# Patient Record
Sex: Female | Born: 1985
Health system: Southern US, Community
[De-identification: ages and names within clinical notes are randomized; demographics above are authoritative.]

## PROBLEM LIST (undated history)

## (undated) DIAGNOSIS — Z789 Other specified health status: Secondary | ICD-10-CM

## (undated) HISTORY — DX: Other specified health status: Z78.9

## (undated) HISTORY — PX: NO PAST SURGERIES: SHX2092

---

## 2012-05-23 ENCOUNTER — Ambulatory Visit (INDEPENDENT_AMBULATORY_CARE_PROVIDER_SITE_OTHER): Payer: 59 | Admitting: Physician Assistant

## 2012-05-23 VITALS — BP 102/68 | HR 78 | Temp 98.5°F | Resp 16 | Ht 65.78 in | Wt 145.8 lb

## 2012-05-23 DIAGNOSIS — L03019 Cellulitis of unspecified finger: Secondary | ICD-10-CM

## 2012-05-23 DIAGNOSIS — IMO0001 Reserved for inherently not codable concepts without codable children: Secondary | ICD-10-CM

## 2012-05-23 DIAGNOSIS — M79644 Pain in right finger(s): Secondary | ICD-10-CM

## 2012-05-23 DIAGNOSIS — M79609 Pain in unspecified limb: Secondary | ICD-10-CM

## 2012-05-23 MED ORDER — DOXYCYCLINE HYCLATE 100 MG PO TABS: 100.0000 mg | ORAL_TABLET | Freq: Two times a day (BID) | ORAL | Status: AC

## 2012-05-23 NOTE — Progress Notes (Signed)
  Subjective:    Patient ID: Tanya Cox, female    DOB: January 25, 1986, 26 y.o.   MRN: [id number]  HPI Pt presents to clinic with index finger of R hand - she has noticed increased erythema and swelling - she is worried that she has an infection - there was no trauma to her finger that she knows of.  No fever or chills.  Review of Systems  Constitutional: Negative for fever and chills.  Skin: Positive for color change and wound. Negative for rash.       Objective:   Physical Exam  Constitutional: She is oriented to person, place, and time. She appears well-developed and well-nourished.  HENT:  Head: Normocephalic and atraumatic.  Right Ear: External ear normal.  Left Ear: External ear normal.  Nose: Nose normal.  Eyes: Conjunctivae are normal.  Neck: Normal range of motion.  Pulmonary/Chest: Effort normal.  Neurological: She is alert and oriented to person, place, and time.  Skin: Skin is warm and dry.     Psychiatric: She has a normal mood and affect. Her behavior is normal. Judgment and thought content normal.          Assessment & Plan:   1. Paronychia of second finger, right  doxycycline (VIBRA-TABS) 100 MG tablet   Pt to use warm soaks to increase healing.  D/w pt warning signs and when to RTC but doubt pt will need further treatment.  Pt agreed and understands the above.

## 2013-02-18 ENCOUNTER — Ambulatory Visit: Payer: Self-pay

## 2013-02-18 ENCOUNTER — Other Ambulatory Visit: Payer: Self-pay | Admitting: Occupational Medicine

## 2013-02-18 DIAGNOSIS — R7612 Nonspecific reaction to cell mediated immunity measurement of gamma interferon antigen response without active tuberculosis: Secondary | ICD-10-CM

## 2013-11-30 IMAGING — CR DG CHEST 1V
1 series · 1 of 1 positions shown · non-contrast
Comparison: None.

CLINICAL DATA: Positive blood test for tuberculosis.

CHEST - 1 VIEW

[view not recorded]
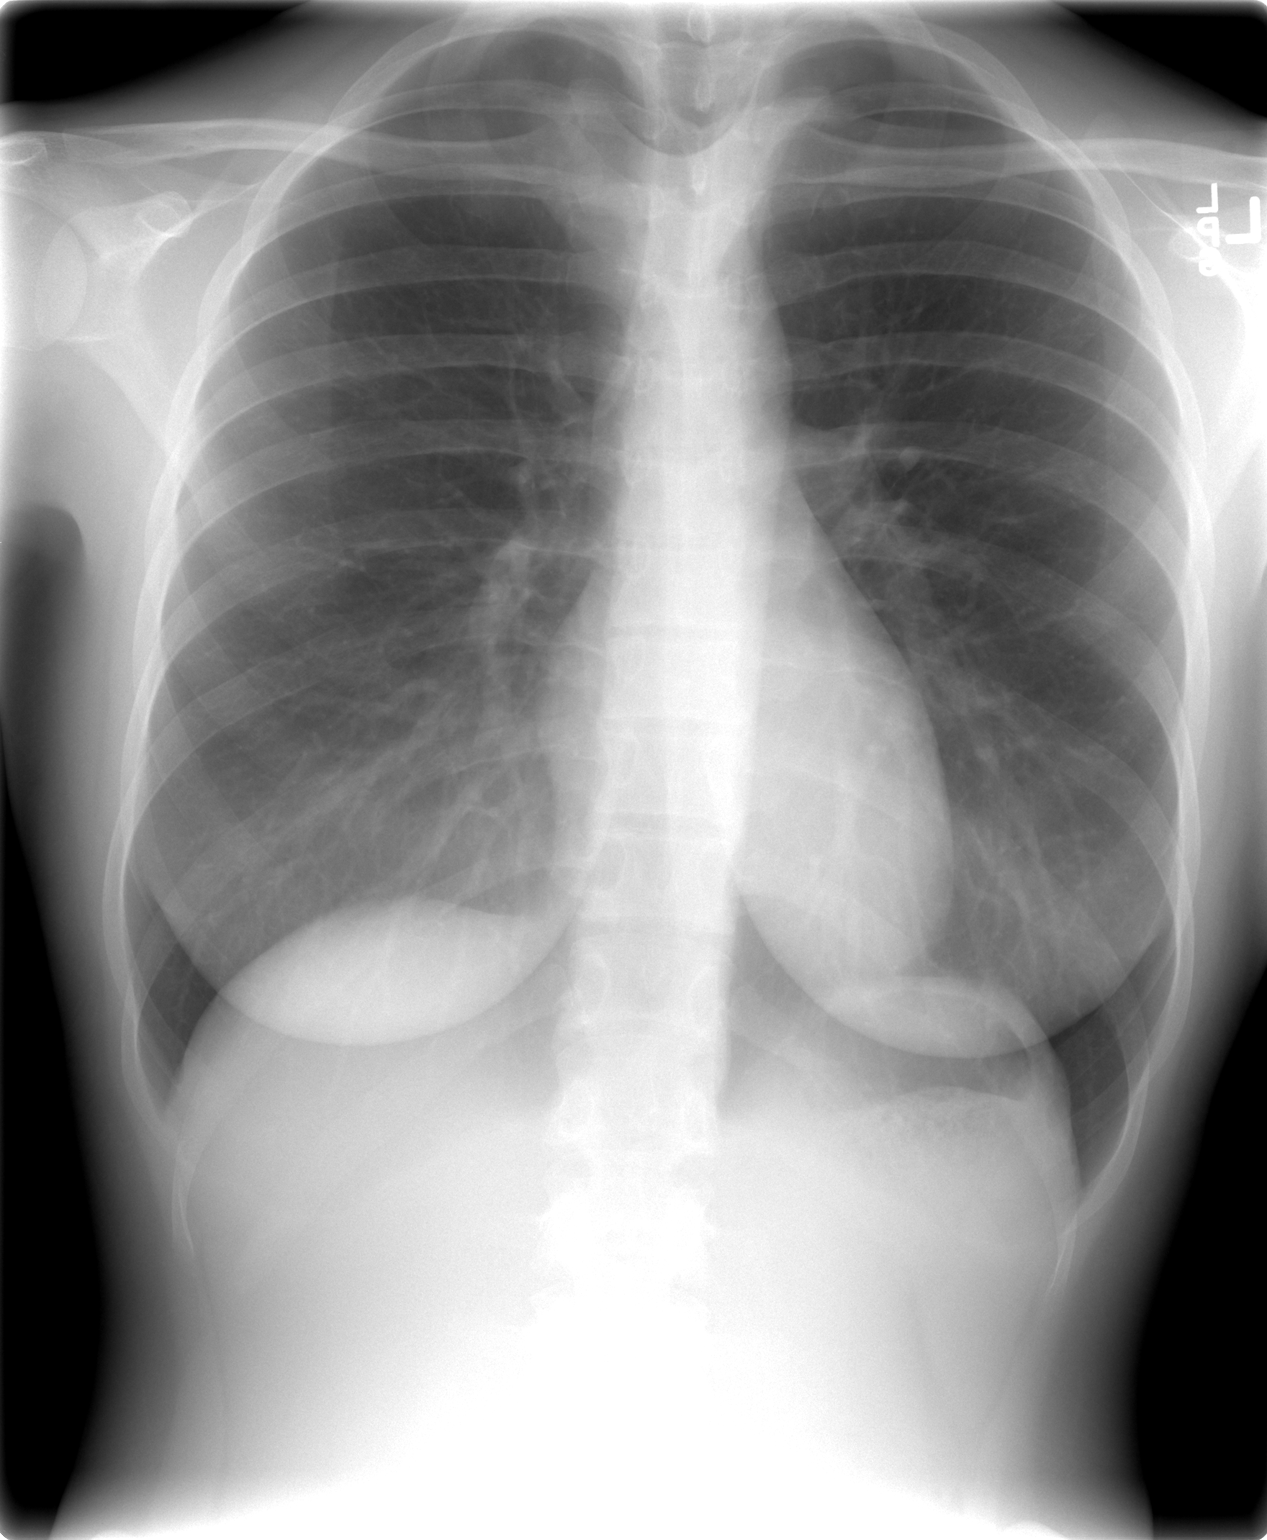

[1 of 1 positions shown; findings below may reference images not displayed]

FINDINGS: Cardiomediastinal silhouette unremarkable.   Lungs clear.
Bronchovascular markings normal.  Pulmonary vascularity normal.  No
pneumothorax.  No pleural effusions.
IMPRESSION: No acute cardiopulmonary disease.

## 2014-11-16 ENCOUNTER — Ambulatory Visit (INDEPENDENT_AMBULATORY_CARE_PROVIDER_SITE_OTHER): Payer: 59 | Admitting: Physician Assistant

## 2014-11-16 VITALS — BP 114/70 | HR 74 | Temp 98.4°F | Resp 18 | Ht 65.5 in | Wt 155.4 lb

## 2014-11-16 DIAGNOSIS — Z111 Encounter for screening for respiratory tuberculosis: Secondary | ICD-10-CM

## 2014-11-16 NOTE — Progress Notes (Signed)
   Subjective:    Patient ID: Tanya Cox, female    DOB: Feb 09, 1986, 29 y.o.   MRN: [id number]  HPI  This is a 29 year old female presenting for TB test for school. She has not recently traveled or been around anyone with TB. She denies cough, fever,  malaise, night sweats or hemoptysis. She reports approx. 2 years ago she had a positive quantiferon gold test - repeat testing and chest xray negative. It was determined to be a false positive. She wishes to do the quantiferon gold test again d/t convenience.  Review of Systems  Constitutional: Negative for fever, chills and diaphoresis.  Respiratory: Negative for cough.    There are no active problems to display for this patient.  Prior to Admission medications   Not on File   No Known Allergies  Patient's social and family history were reviewed.    Objective:   Physical Exam  Constitutional: She is oriented to person, place, and time. She appears well-developed and well-nourished. No distress.  HENT:  Head: Normocephalic and atraumatic.  Right Ear: Hearing normal.  Left Ear: Hearing normal.  Nose: Nose normal.  Eyes: Conjunctivae and lids are normal. Right eye exhibits no discharge. Left eye exhibits no discharge. No scleral icterus.  Pulmonary/Chest: Effort normal. No respiratory distress.  Musculoskeletal: Normal range of motion.  Neurological: She is alert and oriented to person, place, and time.  Skin: Skin is warm, dry and intact. No lesion and no rash noted.  Psychiatric: She has a normal mood and affect. Her speech is normal and behavior is normal. Thought content normal.   BP 114/70 mmHg  Pulse 74  Temp(Src) 98.4 F (36.9 C) (Oral)  Resp 18  Ht 5' 5.5" (1.664 m)  Wt 155 lb 6.4 oz (70.489 kg)  BMI 25.46 kg/m2  SpO2 99%  LMP 11/13/2014     Assessment & Plan:  1. Screening-pulmonary TB - Quantiferon tb gold assay   [place]. Dyke Brackett, [facility]  11/16/2014

## 2014-11-16 NOTE — Patient Instructions (Signed)
Will send your results to you when available. Return with any concerns

## 2014-11-19 LAB — QUANTIFERON TB GOLD ASSAY (BLOOD)
INTERFERON GAMMA RELEASE ASSAY: NEGATIVE
MITOGEN VALUE: 9.68 [IU]/mL
QUANTIFERON TB AG MINUS NIL: 0 [IU]/mL
Quantiferon Nil Value: 0.02 IU/mL
TB AG VALUE: 0.02 [IU]/mL

## 2015-11-11 ENCOUNTER — Ambulatory Visit (INDEPENDENT_AMBULATORY_CARE_PROVIDER_SITE_OTHER): Payer: 59 | Admitting: Emergency Medicine

## 2015-11-11 VITALS — BP 110/64 | HR 84 | Temp 98.4°F | Resp 18 | Wt 154.0 lb

## 2015-11-11 DIAGNOSIS — Z111 Encounter for screening for respiratory tuberculosis: Secondary | ICD-10-CM | POA: Diagnosis not present

## 2015-11-11 NOTE — Progress Notes (Signed)
  Tuberculosis Risk Questionnaire  1. No Were you born outside the [place] in one of the following parts of the world: [place], [place], [place], [place] or Eastern [place]?    2. No Have you traveled outside the [place] and lived for more than one month in one of the following parts of the world: [place], [place], [place], [place] or [place]?    3. No Do you have a compromised immune system such as from any of the following conditions:HIV/AIDS, organ or bone marrow transplantation, diabetes, immunosuppressive medicines (e.g. Prednisone, Remicaide), leukemia, lymphoma, cancer of the head or neck, gastrectomy or jejunal bypass, end-stage renal disease (on dialysis), or silicosis?     4. Yes  Have you ever or do you plan on working in: a residential care center, a health care facility, a jail or prison or homeless shelter?    5. No Have you ever: injected illegal drugs, used crack cocaine, lived in a homeless shelter  or been in jail or prison?     6. Yes  Have you ever been exposed to anyone with infectious tuberculosis?    Tuberculosis Symptom Questionnaire  Do you currently have any of the following symptoms?  1. No Unexplained cough lasting more than 3 weeks?   2. No Unexplained fever lasting more than 3 weeks.   3. No Night Sweats (sweating that leaves the bedclothes and sheets wet)     4. No Shortness of Breath   5. No Chest Pain   6. No Unintentional weight loss    7. No Unexplained fatigue (very tired for no reason)   

## 2015-11-11 NOTE — Progress Notes (Signed)
   Subjective:  Patient ID: Tanya Cox, female    DOB: 07/07/1986  Age: 29 y.o. MRN: [id number]  CC: Labs Only   HPI Tanya Cox presents   He is here for  A TB screening for  School. TB screen attached. History Tanya Cox has no past medical history on file.   She has no past surgical history on file.   Her  family history is not on file.  She   reports that she has never smoked. She does not have any smokeless tobacco history on file. Her alcohol and drug histories are not on file.  No outpatient prescriptions prior to visit.   No facility-administered medications prior to visit.    Social History   Social History  . Marital Status: Single    Spouse Name: N/A  . Number of Children: N/A  . Years of Education: N/A   Social History Main Topics  . Smoking status: Never Smoker   . Smokeless tobacco: None  . Alcohol Use: None  . Drug Use: None  . Sexual Activity: Not Asked   Other Topics Concern  . None   Social History Narrative     Review of Systems  Constitutional: Negative for fever, chills and appetite change.  HENT: Negative for congestion, ear pain, postnasal drip, sinus pressure and sore throat.   Eyes: Negative for pain and redness.  Respiratory: Negative for cough, shortness of breath and wheezing.   Cardiovascular: Negative for leg swelling.  Gastrointestinal: Negative for nausea, vomiting, abdominal pain, diarrhea, constipation and blood in stool.  Endocrine: Negative for polyuria.  Genitourinary: Negative for dysuria, urgency, frequency and flank pain.  Musculoskeletal: Negative for gait problem.  Skin: Negative for rash.  Neurological: Negative for weakness and headaches.  Psychiatric/Behavioral: Negative for confusion and decreased concentration. The patient is not nervous/anxious.     Objective:  BP 110/64 mmHg  Pulse 84  Temp(Src) 98.4 F (36.9 C) (Oral)  Resp 18  Wt 154 lb (69.854 kg)  SpO2 99%  LMP 10/28/2015  Physical  Exam    Assessment & Plan:   Tanya Cox was seen today for labs only.  Diagnoses and all orders for this visit:  Screening-pulmonary TB -     Quantiferon tb gold assay   Ms. Bally does not currently have medications on file.  No orders of the defined types were placed in this encounter.    Appropriate red flag conditions were discussed with the patient as well as actions that should be taken.  Patient expressed his understanding.  Follow-up: Return if symptoms worsen or fail to improve.  Carmelina DaneAnderson, Nikita Humble S, MD

## 2015-11-13 LAB — QUANTIFERON TB GOLD ASSAY (BLOOD)
INTERFERON GAMMA RELEASE ASSAY: NEGATIVE
QUANTIFERON TB AG MINUS NIL: 0.02 [IU]/mL
Quantiferon Nil Value: 0.04 IU/mL
TB Ag value: 0.06 IU/mL

## 2015-11-14 ENCOUNTER — Encounter: Payer: Self-pay | Admitting: *Deleted

## 2016-05-26 DIAGNOSIS — H5203 Hypermetropia, bilateral: Secondary | ICD-10-CM | POA: Diagnosis not present

## 2018-03-04 ENCOUNTER — Ambulatory Visit: Payer: 59 | Admitting: Family Medicine

## 2018-03-04 ENCOUNTER — Encounter: Payer: Self-pay | Admitting: Family Medicine

## 2018-03-04 VITALS — BP 108/64 | HR 75 | Temp 98.6°F | Ht 66.0 in | Wt 149.4 lb

## 2018-03-04 DIAGNOSIS — Z0000 Encounter for general adult medical examination without abnormal findings: Secondary | ICD-10-CM

## 2018-03-04 DIAGNOSIS — Z114 Encounter for screening for human immunodeficiency virus [HIV]: Secondary | ICD-10-CM | POA: Diagnosis not present

## 2018-03-04 LAB — COMPREHENSIVE METABOLIC PANEL
ALT: 13 U/L (ref 0–35)
AST: 16 U/L (ref 0–37)
Albumin: 3.8 g/dL (ref 3.5–5.2)
Alkaline Phosphatase: 53 U/L (ref 39–117)
BILIRUBIN TOTAL: 0.5 mg/dL (ref 0.2–1.2)
BUN: 16 mg/dL (ref 6–23)
CO2: 27 mEq/L (ref 19–32)
CREATININE: 0.67 mg/dL (ref 0.40–1.20)
Calcium: 9.2 mg/dL (ref 8.4–10.5)
Chloride: 106 mEq/L (ref 96–112)
GFR: 110.12 mL/min (ref >60.00)
GLUCOSE: 81 mg/dL (ref 70–99)
Potassium: 3.8 mEq/L (ref 3.5–5.1)
SODIUM: 142 meq/L (ref 135–145)
Total Protein: 6.6 g/dL (ref 6.0–8.3)

## 2018-03-04 LAB — LIPID PANEL
CHOL/HDL RATIO: 2
Cholesterol: 141 mg/dL (ref 0–200)
HDL: 76.6 mg/dL (ref >39.00)
LDL Cholesterol: 54 mg/dL (ref 0–99)
NONHDL: 63.99
Triglycerides: 52 mg/dL (ref 0.0–149.0)
VLDL: 10.4 mg/dL (ref 0.0–40.0)

## 2018-03-04 LAB — CBC
HCT: 38.4 % (ref 36.0–46.0)
Hemoglobin: 13.3 g/dL (ref 12.0–15.0)
MCHC: 34.6 g/dL (ref 30.0–36.0)
MCV: 89.6 fl (ref 78.0–100.0)
PLATELETS: 197 10*3/uL (ref 150.0–400.0)
RBC: 4.29 Mil/uL (ref 3.87–5.11)
RDW: 13.2 % (ref 11.5–15.5)
WBC: 3 10*3/uL — ABNORMAL LOW (ref 4.0–10.5)

## 2018-03-04 NOTE — Progress Notes (Signed)
Chief Complaint  Patient presents with  . Establish Care     Well Woman Tanya Cox is here for a complete physical.   Her last physical was >1 year ago.  Current diet: in general, a "healthy" diet- vegetarian. Current exercise: walking. Weight is stable and she denies daytime fatigue. No LMP recorded. Seatbelt? Yes  Health Maintenance Pap/HPV- No Tetanus- Yes 08/13/2017 HIV screening- No  Past Medical History:  Diagnosis Date  . No known health problems      Past Surgical History:  Procedure Laterality Date  . NO PAST SURGERIES      Medications  Takes no meds routinely.  Allergies No Known Allergies  Review of Systems: Constitutional:  no fevers Eye:  no recent significant change in vision Ear/Nose/Mouth/Throat:  Ears:  no tinnitus or vertigo and no recent change in hearing, Nose/Mouth/Throat:  no complaints of nasal congestion, no sore throat Cardiovascular: no chest pain, no palpitations Respiratory:  no cough and no shortness of breath Gastrointestinal:  no abdominal pain, no change in bowel habits, no significant change in appetite, no nausea, vomiting, diarrhea, or constipation and no black or bloody stool GU:  Female: negative for dysuria, frequency, and incontinence; no abnormal bleeding, pelvic pain, or discharge Musculoskeletal/Extremities:  no pain of the joints Integumentary (Skin/Breast):  no abnormal skin lesions reported Neurologic:  no headaches Endocrine:  denies fatigue Hematologic/Lymphatic:  no unexpected weight changes  Exam BP 108/64 (BP Location: Left Arm, Patient Position: Sitting, Cuff Size: Normal)   Pulse 75   Temp 98.6 F (37 C) (Oral)   Ht 5\' 6"  (1.676 m)   Wt 149 lb 6 oz (67.8 kg)   SpO2 99%   BMI 24.11 kg/m  General:  well developed, well nourished, in no apparent distress Skin:  no significant moles, warts, or growths Head:  no masses, lesions, or tenderness Eyes:  pupils equal and round, sclera anicteric without  injection Ears:  canals without lesions, TMs shiny without retraction, no obvious effusion, no erythema Nose:  nares patent, septum midline, mucosa normal, and no drainage or sinus tenderness Throat/Pharynx:  lips and gingiva without lesion; tongue and uvula midline; non-inflamed pharynx; no exudates or postnasal drainage Neck: neck supple without adenopathy, thyromegaly, or masses Breasts:  Not done Thorax:  nontender Lungs:  clear to auscultation, breath sounds equal bilaterally, no respiratory distress Cardio:  regular rate and rhythm without murmurs, heart sounds without clicks or rubs, point of maximal impulse normal; no lifts, heaves, or thrills Abdomen:  abdomen soft, nontender; bowel sounds normal; no masses or organomegaly Genital: Defer to GYN Musculoskeletal:  symmetrical muscle groups noted without atrophy or deformity Extremities:  no clubbing, cyanosis, or edema, no deformities, no skin discoloration Neuro:  gait normal; deep tendon reflexes normal and symmetric Psych: well oriented with normal range of affect and appropriate judgment/insight  Assessment and Plan  Well adult exam - Plan: CBC, Comprehensive metabolic panel, Lipid panel  Screening for HIV (human immunodeficiency virus) - Plan: HIV antibody   Well 32 y.o. female. Counseled on diet and exercise. Info for GYN provided. Other orders as above. Follow up in 1 yr or prn. The patient voiced understanding and agreement to the plan.  Jilda Rocheicholas Paul West[place]endling, DO 03/04/18 7:59 AM

## 2018-03-04 NOTE — Progress Notes (Signed)
Pre visit review using our clinic review tool, if applicable. No additional management support is needed unless otherwise documented below in the visit note. 

## 2018-03-04 NOTE — Patient Instructions (Addendum)
[facility] [place] at [phone] for an appointment.  They are located at [address], [address], [place], [place], [phone] (right across the hall from our office).  Keep up the good work. Consider lifting weights/pilates/yoga for upper body.   Let us know if you need anything.  1-2 days to get results of labs back.

## 2018-03-05 LAB — HIV ANTIBODY (ROUTINE TESTING W REFLEX): HIV 1&2 Ab, 4th Generation: NONREACTIVE

## 2018-03-06 ENCOUNTER — Other Ambulatory Visit: Payer: Self-pay | Admitting: Family Medicine

## 2018-03-06 DIAGNOSIS — D72819 Decreased white blood cell count, unspecified: Secondary | ICD-10-CM

## 2018-03-08 ENCOUNTER — Other Ambulatory Visit (INDEPENDENT_AMBULATORY_CARE_PROVIDER_SITE_OTHER): Payer: 59

## 2018-03-08 DIAGNOSIS — D72819 Decreased white blood cell count, unspecified: Secondary | ICD-10-CM

## 2018-03-08 LAB — CBC WITH DIFFERENTIAL/PLATELET
Basophils Absolute: 0.1 10*3/uL (ref 0.0–0.1)
Basophils Relative: 1.9 % (ref 0.0–3.0)
EOS PCT: 1.8 % (ref 0.0–5.0)
Eosinophils Absolute: 0.1 10*3/uL (ref 0.0–0.7)
HCT: 38.7 % (ref 36.0–46.0)
HEMOGLOBIN: 13.3 g/dL (ref 12.0–15.0)
LYMPHS ABS: 1.2 10*3/uL (ref 0.7–4.0)
Lymphocytes Relative: 38.6 % (ref 12.0–46.0)
MCHC: 34.3 g/dL (ref 30.0–36.0)
MCV: 89.2 fl (ref 78.0–100.0)
MONOS PCT: 11.4 % (ref 3.0–12.0)
Monocytes Absolute: 0.4 10*3/uL (ref 0.1–1.0)
Neutro Abs: 1.4 10*3/uL (ref 1.4–7.7)
Neutrophils Relative %: 46.3 % (ref 43.0–77.0)
Platelets: 215 10*3/uL (ref 150.0–400.0)
RBC: 4.34 Mil/uL (ref 3.87–5.11)
RDW: 12.9 % (ref 11.5–15.5)
WBC: 3.1 10*3/uL — AB (ref 4.0–10.5)

## 2018-03-08 LAB — EXTRA LAV TOP TUBE

## 2018-03-11 LAB — PATHOLOGIST SMEAR REVIEW

## 2018-08-29 ENCOUNTER — Ambulatory Visit (INDEPENDENT_AMBULATORY_CARE_PROVIDER_SITE_OTHER): Payer: Self-pay | Admitting: Family Medicine

## 2018-08-29 ENCOUNTER — Encounter: Payer: Self-pay | Admitting: Family Medicine

## 2018-08-29 VITALS — BP 120/80 | HR 84 | Temp 98.7°F | Ht 65.0 in | Wt 151.4 lb

## 2018-08-29 DIAGNOSIS — R350 Frequency of micturition: Secondary | ICD-10-CM

## 2018-08-29 DIAGNOSIS — R300 Dysuria: Secondary | ICD-10-CM

## 2018-08-29 LAB — POCT URINALYSIS DIPSTICK
Bilirubin, UA: NEGATIVE
Blood, UA: NEGATIVE
Glucose, UA: NEGATIVE
Ketones, UA: NEGATIVE
LEUKOCYTES UA: NEGATIVE
NITRITE UA: NEGATIVE
PROTEIN UA: NEGATIVE
SPEC GRAV UA: 1.015 (ref 1.010–1.025)
Urobilinogen, UA: NEGATIVE E.U./dL — AB
pH, UA: 7 (ref 5.0–8.0)

## 2018-08-29 NOTE — Patient Instructions (Signed)

## 2018-08-29 NOTE — Progress Notes (Signed)
Tanya Cox is a 32 y.o. female who presents today with concerns of urinary frequency for the last 24 hours. She denies ever having a UTI in the past and denies any risk for STI/STD or burning or discharge on urination.  Review of Systems  Constitutional: Negative for chills, fever and malaise/fatigue.  HENT: Negative for congestion, ear discharge, ear pain, sinus pain and sore throat.   Eyes: Negative.   Respiratory: Negative for cough, sputum production and shortness of breath.   Cardiovascular: Negative.  Negative for chest pain.  Gastrointestinal: Negative for abdominal pain, diarrhea, nausea and vomiting.  Genitourinary: Negative for dysuria, frequency, hematuria and urgency.  Musculoskeletal: Negative for myalgias.  Skin: Negative.   Neurological: Negative for headaches.  Endo/Heme/Allergies: Negative.   Psychiatric/Behavioral: Negative.     O: Vitals:   08/29/18 1750  BP: 120/80  Pulse: 84  Temp: 98.7 F (37.1 C)  SpO2: 100%     Physical Exam  Constitutional: She is oriented to person, place, and time. Vital signs are normal. She appears well-developed and well-nourished. She is active.  Non-toxic appearance. She does not have a sickly appearance.  HENT:  Head: Normocephalic.  Right Ear: Hearing, tympanic membrane, external ear and ear canal normal.  Left Ear: Hearing, tympanic membrane, external ear and ear canal normal.  Nose: Nose normal.  Mouth/Throat: Uvula is midline and oropharynx is clear and moist.  Neck: Normal range of motion. Neck supple.  Cardiovascular: Normal rate, regular rhythm, normal heart sounds and normal pulses.  Pulmonary/Chest: Effort normal and breath sounds normal.  Abdominal: Soft. Bowel sounds are normal. There is no tenderness. There is no rigidity, no rebound, no guarding and no CVA tenderness.  Musculoskeletal: Normal range of motion.  Lymphadenopathy:       Head (right side): No submental and no submandibular adenopathy present.      Head (left side): No submental and no submandibular adenopathy present.    She has no cervical adenopathy.  Neurological: She is alert and oriented to person, place, and time.  Psychiatric: She has a normal mood and affect.  Vitals reviewed.  A: 1. Dysuria   2. Urinary frequency    P: Discussed exam findings, diagnosis etiology and medication use and indications reviewed with patient. Follow- Up and discharge instructions provided. No emergent/urgent issues found on exam.  Patient verbalized understanding of information provided and agrees with plan of care (POC), all questions answered.  1. Dysuria - POCT Urinalysis Dipstick Results for orders placed or performed in visit on 08/29/18 (from the past 24 hour(s))  POCT Urinalysis Dipstick     Status: Abnormal   Collection Time: 08/29/18  6:16 PM  Result Value Ref Range   Color, UA yellow    Clarity, UA clear    Glucose, UA Negative Negative   Bilirubin, UA negative    Ketones, UA negative    Spec Grav, UA 1.015 1.010 - 1.025   Blood, UA negative    pH, UA 7.0 5.0 - 8.0   Protein, UA Negative Negative   Urobilinogen, UA negative (A) 0.2 or 1.0 E.U./dL   Nitrite, UA negative    Leukocytes, UA Negative Negative   Appearance clear    Odor      2. Urinary frequency Exam unremarkable. UA results negative- discussed an observation technique since patient has never had a UTI.

## 2019-04-14 ENCOUNTER — Ambulatory Visit (INDEPENDENT_AMBULATORY_CARE_PROVIDER_SITE_OTHER): Payer: Self-pay | Admitting: Physician Assistant

## 2019-04-14 ENCOUNTER — Other Ambulatory Visit: Payer: Self-pay

## 2019-04-14 DIAGNOSIS — Z111 Encounter for screening for respiratory tuberculosis: Secondary | ICD-10-CM

## 2019-04-16 LAB — TB SKIN TEST
Induration: 0 mm
TB Skin Test: NEGATIVE

## 2019-04-22 ENCOUNTER — Ambulatory Visit (INDEPENDENT_AMBULATORY_CARE_PROVIDER_SITE_OTHER): Payer: Self-pay | Admitting: Nurse Practitioner

## 2019-04-22 ENCOUNTER — Other Ambulatory Visit: Payer: Self-pay

## 2019-04-22 DIAGNOSIS — Z111 Encounter for screening for respiratory tuberculosis: Secondary | ICD-10-CM

## 2019-04-22 NOTE — Progress Notes (Signed)
Patient presents for PPD placement for school Denies previous positive TB test  Denies known exposure to TB   Tuberculin skin test applied to left ventral forearm.  Patient informed to return to [facility] in 48-72 hours for PPD read. Vaccine Information Statement provided to patient.

## 2019-04-24 LAB — TB SKIN TEST
Induration: NEGATIVE mm
TB Skin Test: NEGATIVE

## 2020-02-10 ENCOUNTER — Other Ambulatory Visit: Payer: Self-pay

## 2020-02-11 ENCOUNTER — Encounter: Payer: Self-pay | Admitting: Family Medicine

## 2020-02-11 ENCOUNTER — Other Ambulatory Visit: Payer: Self-pay

## 2020-02-11 ENCOUNTER — Ambulatory Visit (INDEPENDENT_AMBULATORY_CARE_PROVIDER_SITE_OTHER): Payer: 59 | Admitting: Family Medicine

## 2020-02-11 VITALS — BP 123/90 | HR 83 | Temp 97.8°F | Ht 66.0 in | Wt 169.2 lb

## 2020-02-11 DIAGNOSIS — Z0000 Encounter for general adult medical examination without abnormal findings: Secondary | ICD-10-CM

## 2020-02-11 NOTE — Progress Notes (Addendum)
Chief Complaint  Patient presents with  . Annual Exam    no complaints     Well Woman Tanya Cox is here for a complete physical.   Her last physical was >1 year ago.  Current diet: in general, a "healthy" diet. Current exercise: walking. Patient's last menstrual period was 02/05/2020 (approximate).  No fatigue.  Wt has increased.  Seatbelt? Yes  Health Maintenance Pap/HPV- No Tetanus- Yes HIV screening- Yes  Past Medical History:  Diagnosis Date  . No known health problems      Past Surgical History:  Procedure Laterality Date  . NO PAST SURGERIES      Medications  Takes no meds routinely.    Allergies No Known Allergies  Review of Systems: Constitutional:  no unexpected weight changes Eye:  no recent significant change in vision Ear/Nose/Mouth/Throat:  Ears:  no tinnitus or vertigo and no recent change in hearing Nose/Mouth/Throat:  no complaints of nasal congestion, no sore throat Cardiovascular: no chest pain Respiratory:  no cough and no shortness of breath Gastrointestinal:  no abdominal pain, no change in bowel habits GU:  Female: negative for dysuria or pelvic pain Musculoskeletal/Extremities:  no pain of the joints Integumentary (Skin/Breast):  no abnormal skin lesions reported Neurologic:  no headaches Endocrine:  denies fatigue Hematologic/Lymphatic:  No areas of easy bleeding  Exam BP 123/90 (BP Location: Left Arm, Patient Position: Sitting, Cuff Size: Normal)   Pulse 83   Temp 97.8 F (36.6 C) (Temporal)   Ht 5\' 6"  (1.676 m)   Wt 169 lb 3.2 oz (76.7 kg)   LMP 02/05/2020 (Approximate)   SpO2 100%   BMI 27.31 kg/m  General:  well developed, well nourished, in no apparent distress Skin:  no significant moles, warts, or growths Head:  no masses, lesions, or tenderness Eyes:  pupils equal and round, sclera anicteric without injection Ears:  canals without lesions, TMs shiny without retraction, no obvious effusion, no erythema Nose:   nares patent, septum midline, mucosa normal, and no drainage or sinus tenderness Throat/Pharynx:  lips and gingiva without lesion; tongue and uvula midline; non-inflamed pharynx; no exudates or postnasal drainage Neck: neck supple without adenopathy, thyromegaly, or masses Lungs:  clear to auscultation, breath sounds equal bilaterally, no respiratory distress Cardio:  regular rate and rhythm, no bruits, no LE edema Abdomen:  abdomen soft, nontender; bowel sounds normal; no masses or organomegaly Genital: Defer to GYN Musculoskeletal:  symmetrical muscle groups noted without atrophy or deformity Extremities:  no clubbing, cyanosis, or edema, no deformities, no skin discoloration Neuro:  gait normal; deep tendon reflexes normal and symmetric Psych: well oriented with normal range of affect and appropriate judgment/insight  Assessment and Plan  Well adult exam - Plan: CBC, Comprehensive metabolic panel, Lipid panel   Well 34 y.o. female. Counseled on diet and exercise. Other orders as above. Pt is nursing educator. Mind BP.  Follow up in 1 yr or prn. The patient voiced understanding and agreement to the plan.  [address] [place], DO 02/11/20 2:45 PM

## 2020-02-11 NOTE — Patient Instructions (Addendum)
Give us 2-3 business days to get the results of your labs back.   Keep the diet clean and stay active.  Call Center for [facility] at [facility] [place] at [phone] for an appointment.  They are located at [address], [address], [place], [place], [address] (right across the hall from our office).  Let us know if you need anything. 

## 2020-02-12 LAB — CBC
HCT: 38.4 % (ref 36.0–46.0)
Hemoglobin: 13.1 g/dL (ref 12.0–15.0)
MCHC: 34.1 g/dL (ref 30.0–36.0)
MCV: 89 fl (ref 78.0–100.0)
Platelets: 236 10*3/uL (ref 150.0–400.0)
RBC: 4.31 Mil/uL (ref 3.87–5.11)
RDW: 13.1 % (ref 11.5–15.5)
WBC: 4.5 10*3/uL (ref 4.0–10.5)

## 2020-02-12 LAB — LIPID PANEL
Cholesterol: 166 mg/dL (ref 0–200)
HDL: 87.4 mg/dL (ref >39.00)
LDL Cholesterol: 70 mg/dL (ref 0–99)
NonHDL: 79
Total CHOL/HDL Ratio: 2
Triglycerides: 43 mg/dL (ref 0.0–149.0)
VLDL: 8.6 mg/dL (ref 0.0–40.0)

## 2020-02-12 LAB — COMPREHENSIVE METABOLIC PANEL
ALT: 14 U/L (ref 0–35)
AST: 18 U/L (ref 0–37)
Albumin: 4.4 g/dL (ref 3.5–5.2)
Alkaline Phosphatase: 58 U/L (ref 39–117)
BUN: 19 mg/dL (ref 6–23)
CO2: 28 mEq/L (ref 19–32)
Calcium: 9.3 mg/dL (ref 8.4–10.5)
Chloride: 102 mEq/L (ref 96–112)
Creatinine, Ser: 0.67 mg/dL (ref 0.40–1.20)
GFR: 102.28 mL/min (ref >60.00)
Glucose, Bld: 87 mg/dL (ref 70–99)
Potassium: 4 mEq/L (ref 3.5–5.1)
Sodium: 137 mEq/L (ref 135–145)
Total Bilirubin: 0.6 mg/dL (ref 0.2–1.2)
Total Protein: 7.2 g/dL (ref 6.0–8.3)

## 2020-04-01 DIAGNOSIS — Z111 Encounter for screening for respiratory tuberculosis: Secondary | ICD-10-CM | POA: Diagnosis not present

## 2020-04-03 DIAGNOSIS — Z111 Encounter for screening for respiratory tuberculosis: Secondary | ICD-10-CM | POA: Diagnosis not present

## 2021-02-07 DIAGNOSIS — Z111 Encounter for screening for respiratory tuberculosis: Secondary | ICD-10-CM | POA: Diagnosis not present

## 2021-02-09 DIAGNOSIS — Z111 Encounter for screening for respiratory tuberculosis: Secondary | ICD-10-CM | POA: Diagnosis not present

## 2022-01-25 DIAGNOSIS — Z111 Encounter for screening for respiratory tuberculosis: Secondary | ICD-10-CM | POA: Diagnosis not present

## 2022-05-19 ENCOUNTER — Ambulatory Visit (INDEPENDENT_AMBULATORY_CARE_PROVIDER_SITE_OTHER): Payer: 59 | Admitting: Family Medicine

## 2022-05-19 ENCOUNTER — Encounter: Payer: Self-pay | Admitting: Family Medicine

## 2022-05-19 VITALS — BP 110/80 | HR 58 | Temp 98.1°F | Ht 66.0 in | Wt 179.1 lb

## 2022-05-19 DIAGNOSIS — Z0000 Encounter for general adult medical examination without abnormal findings: Secondary | ICD-10-CM

## 2022-05-19 DIAGNOSIS — Z1159 Encounter for screening for other viral diseases: Secondary | ICD-10-CM | POA: Diagnosis not present

## 2022-05-19 LAB — COMPREHENSIVE METABOLIC PANEL
ALT: 16 U/L (ref 0–35)
AST: 18 U/L (ref 0–37)
Albumin: 4.4 g/dL (ref 3.5–5.2)
Alkaline Phosphatase: 80 U/L (ref 39–117)
BUN: 17 mg/dL (ref 6–23)
CO2: 28 mEq/L (ref 19–32)
Calcium: 9.3 mg/dL (ref 8.4–10.5)
Chloride: 102 mEq/L (ref 96–112)
Creatinine, Ser: 0.67 mg/dL (ref 0.40–1.20)
GFR: 114.5 mL/min (ref >60.00)
Glucose, Bld: 75 mg/dL (ref 70–99)
Potassium: 3.9 mEq/L (ref 3.5–5.1)
Sodium: 137 mEq/L (ref 135–145)
Total Bilirubin: 0.7 mg/dL (ref 0.2–1.2)
Total Protein: 7.4 g/dL (ref 6.0–8.3)

## 2022-05-19 LAB — CBC
HCT: 39.9 % (ref 36.0–46.0)
Hemoglobin: 13.7 g/dL (ref 12.0–15.0)
MCHC: 34.3 g/dL (ref 30.0–36.0)
MCV: 88.3 fl (ref 78.0–100.0)
Platelets: 257 10*3/uL (ref 150.0–400.0)
RBC: 4.52 Mil/uL (ref 3.87–5.11)
RDW: 13.2 % (ref 11.5–15.5)
WBC: 5 10*3/uL (ref 4.0–10.5)

## 2022-05-19 LAB — LIPID PANEL
Cholesterol: 168 mg/dL (ref 0–200)
HDL: 77.8 mg/dL (ref >39.00)
LDL Cholesterol: 75 mg/dL (ref 0–99)
NonHDL: 89.75
Total CHOL/HDL Ratio: 2
Triglycerides: 74 mg/dL (ref 0.0–149.0)
VLDL: 14.8 mg/dL (ref 0.0–40.0)

## 2022-05-19 NOTE — Progress Notes (Signed)
Chief Complaint  Patient presents with   Annual Exam     Well Woman Tanya Cox is here for a complete physical.   Her last physical was >1 year ago.  Current diet: in general, a "healthy" diet. Current exercise: cycling, starting strength training. Fatigue out of ordinary? No Seatbelt? Yes Advanced directive? Yes  Health Maintenance Pap/HPV- No Tetanus- Yes HIV screening- Yes Hep C screening- No  Past Medical History:  Diagnosis Date   No known health problems      Past Surgical History:  Procedure Laterality Date   NO PAST SURGERIES      Medications  Takes no meds routinely.     Allergies No Known Allergies  Review of Systems: Constitutional:  no unexpected weight changes Eye:  no recent significant change in vision Ear/Nose/Mouth/Throat:  Ears:  no tinnitus or vertigo and no recent change in hearing Nose/Mouth/Throat:  no complaints of nasal congestion, no sore throat Cardiovascular: no chest pain Respiratory:  no cough and no shortness of breath Gastrointestinal:  no abdominal pain, no change in bowel habits GU:  Female: negative for dysuria or pelvic pain Musculoskeletal/Extremities:  no pain of the joints Integumentary (Skin/Breast):  no abnormal skin lesions reported Neurologic:  no headaches Endocrine:  denies fatigue Hematologic/Lymphatic:  No areas of easy bleeding  Exam BP 110/80   Pulse (!) 58   Temp 98.1 F (36.7 C) (Oral)   Ht 5\' 6"  (1.676 m)   Wt 179 lb 2 oz (81.3 kg)   SpO2 99%   BMI 28.91 kg/m  General:  well developed, well nourished, in no apparent distress Skin:  no significant moles, warts, or growths Head:  no masses, lesions, or tenderness Eyes:  pupils equal and round, sclera anicteric without injection Ears:  canals without lesions, TMs shiny without retraction, no obvious effusion, no erythema Nose:  nares patent, septum midline, mucosa normal, and no drainage or sinus tenderness Throat/Pharynx:  lips and gingiva  without lesion; tongue and uvula midline; non-inflamed pharynx; no exudates or postnasal drainage Neck: neck supple without adenopathy, thyromegaly, or masses Lungs:  clear to auscultation, breath sounds equal bilaterally, no respiratory distress Cardio:  regular rate and rhythm, no bruits, no LE edema Abdomen:  abdomen soft, nontender; bowel sounds normal; no masses or organomegaly Genital: Defer to GYN Musculoskeletal:  symmetrical muscle groups noted without atrophy or deformity Extremities:  no clubbing, cyanosis, or edema, no deformities, no skin discoloration Neuro:  gait normal; deep tendon reflexes normal and symmetric Psych: well oriented with normal range of affect and appropriate judgment/insight  Assessment and Plan  Well adult exam - Plan: CBC, Comprehensive metabolic panel, Lipid panel  Encounter for hepatitis C screening test for low risk patient - Plan: Hepatitis C antibody   Well 36 y.o. female. Counseled on diet and exercise. Advanced directive form requested today.  GYN info provided. Needs to get her cervical cancer screening.  Hep C screening w today's labs.  Other orders as above. Follow up in 1 yr or prn. The patient voiced understanding and agreement to the plan.  [address] [place], DO 05/19/22 1:26 PM

## 2022-05-19 NOTE — Patient Instructions (Addendum)
Give us 2-3 business days to get the results of your labs back.   Keep the diet clean and stay active.  Please get me a copy of your advanced directive form at your convenience.   Call Center for Women[facility] at [facility] [place] at [phone] for an appointment.  They are located at [address], [address], [place], [place], [address] (right across the hall from our office).  Let us know if you need anything.  

## 2022-05-22 LAB — HEPATITIS C ANTIBODY: Hepatitis C Ab: NONREACTIVE

## 2022-09-25 DIAGNOSIS — Z23 Encounter for immunization: Secondary | ICD-10-CM | POA: Diagnosis not present

## 2022-12-08 ENCOUNTER — Telehealth: Payer: Commercial Managed Care - PPO | Admitting: Family Medicine

## 2022-12-08 DIAGNOSIS — L03039 Cellulitis of unspecified toe: Secondary | ICD-10-CM | POA: Diagnosis not present

## 2022-12-08 DIAGNOSIS — L03818 Cellulitis of other sites: Secondary | ICD-10-CM | POA: Diagnosis not present

## 2022-12-08 MED ORDER — CEPHALEXIN 500 MG PO CAPS: 500.0000 mg | ORAL_CAPSULE | Freq: Four times a day (QID) | ORAL | 0 refills | Status: AC

## 2022-12-08 NOTE — Progress Notes (Signed)
E Visit for Cellulitis  We are sorry that you are not feeling well. Here is how we plan to help!  Based on what you shared with me it looks like you have cellulitis.  Cellulitis looks like areas of skin redness, swelling, and warmth; it develops as a result of bacteria entering under the skin. Little red spots and/or bleeding can be seen in skin, and tiny surface sacs containing fluid can occur. Fever can be present. Cellulitis is almost always on one side of a body, and the lower limbs are the most common site of involvement.   I have prescribed:  Keflex 500mg  take one by mouth four times a day for 5 days  Warm epsom salt soaks to toe TID and allow to drain. If worsening or not draining go to urgent care for drainaage.   HOME CARE:  Take your medications as ordered and take all of them, even if the skin irritation appears to be healing.   GET HELP RIGHT AWAY IF:  Symptoms that don't begin to go away within 48 hours. Severe redness persists or worsens If the area turns color, spreads or swells. If it blisters and opens, develops yellow-brown crust or bleeds. You develop a fever or chills. If the pain increases or becomes unbearable.  Are unable to keep fluids and food down.  MAKE SURE YOU   Understand these instructions. Will watch your condition. Will get help right away if you are not doing well or get worse.  Thank you for choosing an e-visit.  Your e-visit answers were reviewed by a board certified advanced clinical practitioner to complete your personal care plan. Depending upon the condition, your plan could have included both over the counter or prescription medications.  Please review your pharmacy choice. Make sure the pharmacy is open so you can pick up prescription now. If there is a problem, you may contact your provider through [employer] and have the prescription routed to another pharmacy.  Your safety is important to [place]. If you have drug allergies check your  prescription carefully.   For the next 24 hours you can use MyChart to ask questions about today's visit, request a non-urgent call back, or ask for a work or school excuse. You will get an email in the next two days asking about your experience. I hope that your e-visit has been valuable and will speed your recovery.    have provided 5 minutes of non face to face time during this encounter for chart review and documentation.
